# Patient Record
Sex: Male | Born: 1995 | Race: White | Hispanic: No | Marital: Single | Smoking: Current every day smoker
Health system: Southern US, Community
[De-identification: ages and names within clinical notes are randomized; demographics above are authoritative.]

---
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
## 2017-01-20 ENCOUNTER — Emergency Department (HOSPITAL_COMMUNITY): Payer: 59

## 2017-01-20 ENCOUNTER — Inpatient Hospital Stay (HOSPITAL_COMMUNITY)
Admission: EM | Admit: 2017-01-20 | Discharge: 2017-01-22 | DRG: 988 | Disposition: A | Discharge status: Home / Self Care | Payer: 59 | Attending: General Surgery | Admitting: General Surgery

## 2017-01-20 ENCOUNTER — Encounter (HOSPITAL_COMMUNITY): Payer: Self-pay | Admitting: Emergency Medicine

## 2017-01-20 ENCOUNTER — Inpatient Hospital Stay (HOSPITAL_COMMUNITY): Payer: 59

## 2017-01-20 DIAGNOSIS — S32110A Nondisplaced Zone I fracture of sacrum, initial encounter for closed fracture: Secondary | ICD-10-CM | POA: Diagnosis present

## 2017-01-20 DIAGNOSIS — Y92410 Unspecified street and highway as the place of occurrence of the external cause: Secondary | ICD-10-CM

## 2017-01-20 DIAGNOSIS — F17210 Nicotine dependence, cigarettes, uncomplicated: Secondary | ICD-10-CM | POA: Diagnosis present

## 2017-01-20 DIAGNOSIS — S71122A Laceration with foreign body, left thigh, initial encounter: Secondary | ICD-10-CM | POA: Diagnosis present

## 2017-01-20 DIAGNOSIS — J939 Pneumothorax, unspecified: Secondary | ICD-10-CM

## 2017-01-20 DIAGNOSIS — Z23 Encounter for immunization: Secondary | ICD-10-CM | POA: Diagnosis not present

## 2017-01-20 DIAGNOSIS — Y906 Blood alcohol level of 120-199 mg/100 ml: Secondary | ICD-10-CM

## 2017-01-20 DIAGNOSIS — S27329A Contusion of lung, unspecified, initial encounter: Secondary | ICD-10-CM | POA: Diagnosis present

## 2017-01-20 DIAGNOSIS — S270XXA Traumatic pneumothorax, initial encounter: Secondary | ICD-10-CM

## 2017-01-20 DIAGNOSIS — M79605 Pain in left leg: Secondary | ICD-10-CM | POA: Diagnosis present

## 2017-01-20 DIAGNOSIS — F10129 Alcohol abuse with intoxication, unspecified: Secondary | ICD-10-CM | POA: Diagnosis present

## 2017-01-20 DIAGNOSIS — K668 Other specified disorders of peritoneum: Secondary | ICD-10-CM

## 2017-01-20 LAB — URINALYSIS, ROUTINE W REFLEX MICROSCOPIC
BILIRUBIN URINE: NEGATIVE
Bacteria, UA: NONE SEEN
Glucose, UA: NEGATIVE mg/dL
KETONES UR: NEGATIVE mg/dL
LEUKOCYTES UA: NEGATIVE
NITRITE: NEGATIVE
PH: 5 (ref 5.0–8.0)
Protein, ur: NEGATIVE mg/dL
SPECIFIC GRAVITY, URINE: 1.041 — AB (ref 1.005–1.030)

## 2017-01-20 LAB — I-STAT CHEM 8, ED
BUN: 18 mg/dL (ref 6–20)
Calcium, Ion: 1.02 mmol/L — ABNORMAL LOW (ref 1.15–1.40)
Chloride: 106 mmol/L (ref 101–111)
Creatinine, Ser: 1.2 mg/dL (ref 0.61–1.24)
Glucose, Bld: 113 mg/dL — ABNORMAL HIGH (ref 65–99)
HCT: 43 % (ref 39.0–52.0)
Hemoglobin: 14.6 g/dL (ref 13.0–17.0)
Potassium: 3.8 mmol/L (ref 3.5–5.1)
Sodium: 142 mmol/L (ref 135–145)
TCO2: 23 mmol/L (ref 0–100)

## 2017-01-20 LAB — CDS SEROLOGY

## 2017-01-20 LAB — COMPREHENSIVE METABOLIC PANEL
ALT: 109 U/L — ABNORMAL HIGH (ref 17–63)
ANION GAP: 14 (ref 5–15)
AST: 145 U/L — ABNORMAL HIGH (ref 15–41)
Albumin: 4.3 g/dL (ref 3.5–5.0)
Alkaline Phosphatase: 71 U/L (ref 38–126)
BUN: 14 mg/dL (ref 6–20)
CALCIUM: 8.7 mg/dL — AB (ref 8.9–10.3)
CHLORIDE: 104 mmol/L (ref 101–111)
CO2: 21 mmol/L — AB (ref 22–32)
Creatinine, Ser: 1.08 mg/dL (ref 0.61–1.24)
Glucose, Bld: 113 mg/dL — ABNORMAL HIGH (ref 65–99)
Potassium: 3.8 mmol/L (ref 3.5–5.1)
SODIUM: 139 mmol/L (ref 135–145)
Total Bilirubin: 0.7 mg/dL (ref 0.3–1.2)
Total Protein: 6.7 g/dL (ref 6.5–8.1)

## 2017-01-20 LAB — CBC
HCT: 42.2 % (ref 39.0–52.0)
Hemoglobin: 14.5 g/dL (ref 13.0–17.0)
MCH: 31.3 pg (ref 26.0–34.0)
MCHC: 34.4 g/dL (ref 30.0–36.0)
MCV: 91.1 fL (ref 78.0–100.0)
Platelets: 251 10*3/uL (ref 150–400)
RBC: 4.63 MIL/uL (ref 4.22–5.81)
RDW: 12.2 % (ref 11.5–15.5)
WBC: 17.9 10*3/uL — ABNORMAL HIGH (ref 4.0–10.5)

## 2017-01-20 LAB — I-STAT CG4 LACTIC ACID, ED: Lactic Acid, Venous: 3.03 mmol/L (ref 0.5–1.9)

## 2017-01-20 LAB — PROTIME-INR
INR: 1.12
Prothrombin Time: 14.5 seconds (ref 11.4–15.2)

## 2017-01-20 LAB — SAMPLE TO BLOOD BANK

## 2017-01-20 LAB — ETHANOL: Alcohol, Ethyl (B): 130 mg/dL — ABNORMAL HIGH (ref <5)

## 2017-01-20 MED ORDER — HYDROCODONE-ACETAMINOPHEN 5-325 MG PO TABS
1.0000 | ORAL_TABLET | ORAL | Status: DC | PRN
  Administered 2017-01-20 (×3): 1 via ORAL
  Filled 2017-01-20 (×3): qty 1

## 2017-01-20 MED ORDER — ONDANSETRON HCL 4 MG/2ML IJ SOLN: 4.0000 mg | Freq: Four times a day (QID) | INTRAMUSCULAR | Status: DC | PRN

## 2017-01-20 MED ORDER — TETANUS-DIPHTH-ACELL PERTUSSIS 5-2.5-18.5 LF-MCG/0.5 IM SUSP
INTRAMUSCULAR | Status: AC
  Filled 2017-01-20: qty 0.5

## 2017-01-20 MED ORDER — TETANUS-DIPHTH-ACELL PERTUSSIS 5-2.5-18.5 LF-MCG/0.5 IM SUSP
0.5000 mL | Freq: Once | INTRAMUSCULAR | Status: AC
  Administered 2017-01-20: 0.5 mL via INTRAMUSCULAR

## 2017-01-20 MED ORDER — ENOXAPARIN SODIUM 40 MG/0.4ML ~~LOC~~ SOLN
40.0000 mg | SUBCUTANEOUS | Status: DC
  Administered 2017-01-21 – 2017-01-22 (×2): 40 mg via SUBCUTANEOUS
  Filled 2017-01-20 (×2): qty 0.4

## 2017-01-20 MED ORDER — SODIUM CHLORIDE 0.9 % IV BOLUS (SEPSIS)
500.0000 mL | Freq: Once | INTRAVENOUS | Status: AC
  Administered 2017-01-20: 500 mL via INTRAVENOUS

## 2017-01-20 MED ORDER — FENTANYL CITRATE (PF) 100 MCG/2ML IJ SOLN
INTRAMUSCULAR | Status: AC
  Filled 2017-01-20: qty 2

## 2017-01-20 MED ORDER — ONDANSETRON HCL 4 MG PO TABS: 4.0000 mg | ORAL_TABLET | Freq: Four times a day (QID) | ORAL | Status: DC | PRN

## 2017-01-20 MED ORDER — IOPAMIDOL (ISOVUE-300) INJECTION 61%
INTRAVENOUS | Status: AC
  Filled 2017-01-20: qty 100

## 2017-01-20 MED ORDER — HYDROMORPHONE HCL 1 MG/ML IJ SOLN
1.0000 mg | INTRAMUSCULAR | Status: DC | PRN
  Administered 2017-01-20 – 2017-01-21 (×5): 1 mg via INTRAVENOUS
  Filled 2017-01-20 (×6): qty 1

## 2017-01-20 MED ORDER — POLYETHYLENE GLYCOL 3350 17 G PO PACK
17.0000 g | PACK | Freq: Every day | ORAL | Status: DC
  Administered 2017-01-20 – 2017-01-22 (×3): 17 g via ORAL
  Filled 2017-01-20 (×3): qty 1

## 2017-01-20 MED ORDER — SODIUM CHLORIDE 0.9 % IV BOLUS (SEPSIS)
1000.0000 mL | Freq: Once | INTRAVENOUS | Status: AC
  Administered 2017-01-20: 1000 mL via INTRAVENOUS

## 2017-01-20 MED ORDER — ACETAMINOPHEN 325 MG PO TABS: 650.0000 mg | ORAL_TABLET | ORAL | Status: DC | PRN

## 2017-01-20 MED ORDER — SODIUM CHLORIDE 0.9 % IV SOLN
INTRAVENOUS | Status: DC
  Administered 2017-01-20: 09:00:00 via INTRAVENOUS
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
## 2017-01-20 NOTE — ED Notes (Signed)
Pt elected to urinate in his bed instead of using urinal kept at bedside. Changed bed linens and cleaned patient.

## 2017-01-20 NOTE — Progress Notes (Signed)
Spoke with family at bedside about plan of care. Answered their questions  Ordered PT consult Pt need incentive spirometry in room No complaints of abdominal pain  Chest xray at 3pm today  Weight bearing as tolerated. Encourage ambulation  Mattie MarlinJessica Solange Emry, [facility] Pager [phone]

## 2017-01-20 NOTE — ED Provider Notes (Signed)
D/w radiology We discussed CT findings I discussed this with patient He is awake/alert and requesting to have ccollar removed He is tachycardic IV fluids ordered D/w dr [place], he will come to see patient and admit He requests to call orthopedics as well  I have removed c-collar as ct imaging negative and no midline tenderness     Zadie Rhineonald Meeah Totino, MD 01/20/17 [phone]

## 2017-01-20 NOTE — ED Notes (Signed)
Patient transported to X-ray 
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
## 2017-01-20 NOTE — ED Provider Notes (Signed)
Pt awake/alert but mildly agitated Vitals improved Heart rate improving abd soft on re-examination Awaiting admission at this time    Zadie Rhineonald Olson Lucarelli, MD 01/20/17 [phone]
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
## 2017-01-20 NOTE — ED Notes (Signed)
Hannah PA-C at bedside stapling
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
## 2017-01-20 NOTE — ED Notes (Signed)
Pt adamantly refusing pain meds.
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
## 2017-01-21 DIAGNOSIS — S270XXA Traumatic pneumothorax, initial encounter: Secondary | ICD-10-CM

## 2017-01-21 LAB — CBC
HCT: 35.5 % — ABNORMAL LOW (ref 39.0–52.0)
HEMOGLOBIN: 12 g/dL — AB (ref 13.0–17.0)
MCH: 31.2 pg (ref 26.0–34.0)
MCHC: 33.8 g/dL (ref 30.0–36.0)
MCV: 92.2 fL (ref 78.0–100.0)
Platelets: 171 10*3/uL (ref 150–400)
RBC: 3.85 MIL/uL — AB (ref 4.22–5.81)
RDW: 12.5 % (ref 11.5–15.5)
WBC: 7 10*3/uL (ref 4.0–10.5)

## 2017-01-21 LAB — BASIC METABOLIC PANEL
Anion gap: 6 (ref 5–15)
BUN: 6 mg/dL (ref 6–20)
CHLORIDE: 104 mmol/L (ref 101–111)
CO2: 27 mmol/L (ref 22–32)
CREATININE: 0.8 mg/dL (ref 0.61–1.24)
Calcium: 8.7 mg/dL — ABNORMAL LOW (ref 8.9–10.3)
GFR calc Af Amer: >60 mL/min (ref >60)
GFR calc non Af Amer: >60 mL/min (ref >60)
Glucose, Bld: 93 mg/dL (ref 65–99)
Potassium: 3.6 mmol/L (ref 3.5–5.1)
SODIUM: 137 mmol/L (ref 135–145)

## 2017-01-21 LAB — HIV ANTIBODY (ROUTINE TESTING W REFLEX): HIV SCREEN 4TH GENERATION: NONREACTIVE

## 2017-01-21 MED ORDER — HYDROMORPHONE HCL 1 MG/ML IJ SOLN
0.5000 mg | INTRAMUSCULAR | Status: DC | PRN
  Administered 2017-01-22: 1 mg via INTRAVENOUS
  Filled 2017-01-21: qty 1

## 2017-01-21 MED ORDER — OXYCODONE HCL 5 MG PO TABS
5.0000 mg | ORAL_TABLET | ORAL | Status: DC | PRN
  Administered 2017-01-21 – 2017-01-22 (×6): 10 mg via ORAL
  Filled 2017-01-21 (×6): qty 2
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
## 2017-01-21 NOTE — Progress Notes (Signed)
PT Cancellation Note  Patient Details Name: Darryl Tran MRN: [id number] DOB: November 25, 1995   Cancelled Treatment:    Reason Eval/Treat Not Completed: PT screened, no needs identified, will sign off;Patient declined, no reason specified. Pt is ambulating out of bathroom when PT arrives. Reports he has been up multiple times to the bathroom and out into the hall. Reports that he has all required equipment at home and is not in any need of therapy services. Pt is noticeably frustrated and angry to answer questions. PT will sign off at this time. Please re-order if a need arises.    Colin BroachSabra M. [name] PT, DPT  [phone]  01/21/2017, 1:16 PM

## 2017-01-22 LAB — CBC
HEMATOCRIT: 37.1 % — AB (ref 39.0–52.0)
HEMOGLOBIN: 12.1 g/dL — AB (ref 13.0–17.0)
MCH: 30.3 pg (ref 26.0–34.0)
MCHC: 32.6 g/dL (ref 30.0–36.0)
MCV: 93 fL (ref 78.0–100.0)
Platelets: 169 10*3/uL (ref 150–400)
RBC: 3.99 MIL/uL — AB (ref 4.22–5.81)
RDW: 12.4 % (ref 11.5–15.5)
WBC: 6.5 10*3/uL (ref 4.0–10.5)

## 2017-01-22 LAB — BASIC METABOLIC PANEL
Anion gap: 11 (ref 5–15)
BUN: 7 mg/dL (ref 6–20)
CHLORIDE: 103 mmol/L (ref 101–111)
CO2: 23 mmol/L (ref 22–32)
Calcium: 8.7 mg/dL — ABNORMAL LOW (ref 8.9–10.3)
Creatinine, Ser: 0.82 mg/dL (ref 0.61–1.24)
Glucose, Bld: 97 mg/dL (ref 65–99)
Potassium: 3.8 mmol/L (ref 3.5–5.1)
SODIUM: 137 mmol/L (ref 135–145)

## 2017-01-22 MED ORDER — ACETAMINOPHEN 325 MG PO TABS: 650.0000 mg | ORAL_TABLET | ORAL | Status: AC | PRN

## 2017-01-22 MED ORDER — POLYETHYLENE GLYCOL 3350 17 G PO PACK: 17.0000 g | PACK | Freq: Every day | ORAL | 0 refills | Status: AC | PRN

## 2017-01-22 MED ORDER — OXYCODONE HCL 5 MG PO TABS: 5.0000 mg | ORAL_TABLET | Freq: Four times a day (QID) | ORAL | 0 refills | Status: DC | PRN
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
## 2017-01-22 NOTE — Discharge Instructions (Signed)
Mobilize/walk as comfort allows with full weightbearing as tolerated on both of your legs.   Follow-up with orthopedics (Dr. Magnus IvanBlackman) in a few weeks. Avoid high impact aerobic activities including contact sports and heavy manual labor for the next 4-6 weeks.

## 2017-02-02 ENCOUNTER — Ambulatory Visit (INDEPENDENT_AMBULATORY_CARE_PROVIDER_SITE_OTHER): Payer: Self-pay | Admitting: Physician Assistant

## 2017-02-07 ENCOUNTER — Ambulatory Visit (INDEPENDENT_AMBULATORY_CARE_PROVIDER_SITE_OTHER): Payer: 59

## 2017-02-07 ENCOUNTER — Encounter (INDEPENDENT_AMBULATORY_CARE_PROVIDER_SITE_OTHER): Payer: Self-pay | Admitting: Physician Assistant

## 2017-02-07 ENCOUNTER — Ambulatory Visit (INDEPENDENT_AMBULATORY_CARE_PROVIDER_SITE_OTHER): Payer: 59 | Admitting: Physician Assistant

## 2017-02-07 DIAGNOSIS — S32110D Nondisplaced Zone I fracture of sacrum, subsequent encounter for fracture with routine healing: Secondary | ICD-10-CM

## 2017-02-07 DIAGNOSIS — R102 Pelvic and perineal pain: Secondary | ICD-10-CM | POA: Diagnosis not present
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
## 2017-02-12 ENCOUNTER — Telehealth (INDEPENDENT_AMBULATORY_CARE_PROVIDER_SITE_OTHER): Payer: Self-pay | Admitting: *Deleted

## 2017-02-12 ENCOUNTER — Encounter (INDEPENDENT_AMBULATORY_CARE_PROVIDER_SITE_OTHER): Payer: Self-pay

## 2017-02-12 NOTE — Telephone Encounter (Signed)
Patient's father called in this morning in regards to needing to get his son a note to return to work? His CB # [phone]. Thank you

## 2017-02-12 NOTE — Telephone Encounter (Signed)
Faxed to the provided number [phone] that his dad gave me

## 2017-02-12 NOTE — Telephone Encounter (Signed)
Please advise 

## 2017-02-12 NOTE — Telephone Encounter (Signed)
Ok for a return to work note

## 2017-12-10 ENCOUNTER — Other Ambulatory Visit: Payer: Self-pay

## 2017-12-10 ENCOUNTER — Encounter: Payer: Self-pay | Admitting: Emergency Medicine

## 2017-12-10 ENCOUNTER — Emergency Department
Admission: EM | Admit: 2017-12-10 | Discharge: 2017-12-10 | Disposition: A | Discharge status: Home / Self Care | Payer: Managed Care, Other (non HMO) | Source: Home / Self Care | Attending: Family Medicine | Admitting: Family Medicine

## 2017-12-10 DIAGNOSIS — S0990XA Unspecified injury of head, initial encounter: Secondary | ICD-10-CM

## 2017-12-10 MED ORDER — AMOXICILLIN 875 MG PO TABS: 875.0000 mg | ORAL_TABLET | Freq: Two times a day (BID) | ORAL | 0 refills | Status: AC
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
## 2017-12-10 NOTE — Discharge Instructions (Signed)
Try warm salt water gargles several times daily. ?
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
